# Patient Record
Sex: Female | Born: 1985
Health system: Southern US, Community
[De-identification: ages and names within clinical notes are randomized; demographics above are authoritative.]

## PROBLEM LIST (undated history)

## (undated) DIAGNOSIS — F329 Major depressive disorder, single episode, unspecified: Secondary | ICD-10-CM

## (undated) DIAGNOSIS — F32A Depression, unspecified: Secondary | ICD-10-CM

## (undated) DIAGNOSIS — F419 Anxiety disorder, unspecified: Secondary | ICD-10-CM

## (undated) HISTORY — PX: GASTRIC ROUX-EN-Y: SHX5262

## (undated) HISTORY — PX: CHOLECYSTECTOMY: SHX55

## (undated) HISTORY — PX: WISDOM TOOTH EXTRACTION: SHX21

## (undated) HISTORY — PX: HERNIA REPAIR: SHX51

## (undated) HISTORY — PX: ABDOMINAL HYSTERECTOMY: SHX81

---
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
## 2004-03-05 ENCOUNTER — Emergency Department (HOSPITAL_COMMUNITY): Admission: EM | Admit: 2004-03-05 | Discharge: 2004-03-05 | Payer: Self-pay | Admitting: Emergency Medicine

## 2006-10-06 IMAGING — CT CT ABDOMEN W/ CM
1 of 3 series · 14 of 32 positions shown, 19 images · IV contrast (OMNI 300 100)
Comparison: None.

CLINICAL DATA: Left abdominal pain following a fall down stairs three days ago.
 CT ABDOMEN AND PELVIS WITH CONTRAST:
TECHNIQUE: Routine multidetector helical imaging utilizing oral and IV contrast ? 100 cc of Omnipaque 300.
 CT ABDOMEN WITH CONTRAST:
 The lung bases are clear.  No obvious rib fractures or pleural effusions.  The liver, spleen, pancreas, and adrenals are normal.  No hemoperitoneum or free air.  Early and delayed images through the kidneys show no obstruction or lacerations.  No retroperitoneal hematoma.  No adenopathy or ascites.

[Series 2: routine abdomen · axial · 0.74mm/px · z∈[-431,-71]mm · 14 of 81 slices shown, 19 images]
[im 5/81  soft-tissue]
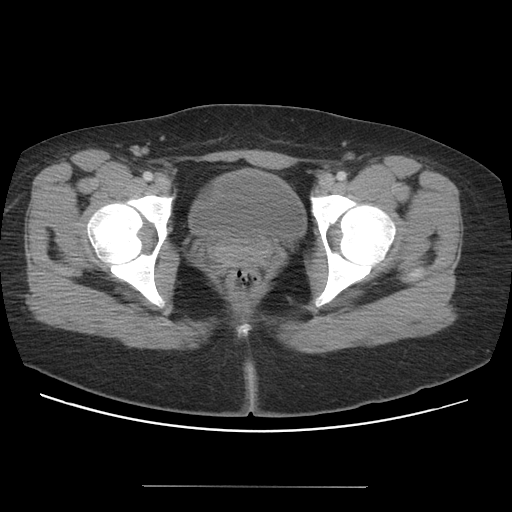
[im 5/81  bone]
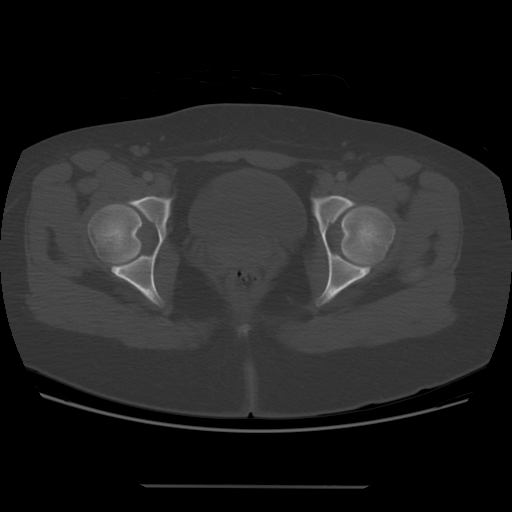
[im 13/81  soft-tissue]
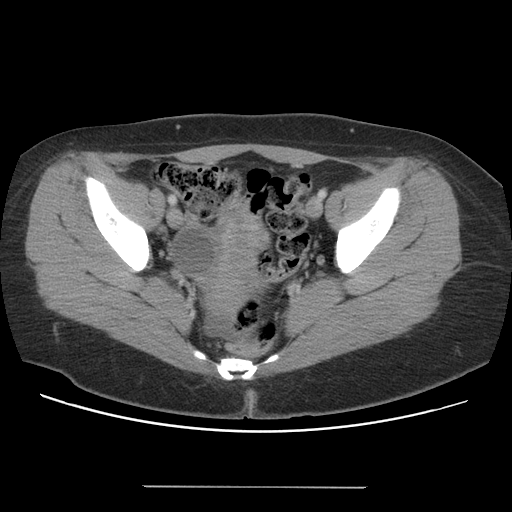
[im 17/81  soft-tissue]
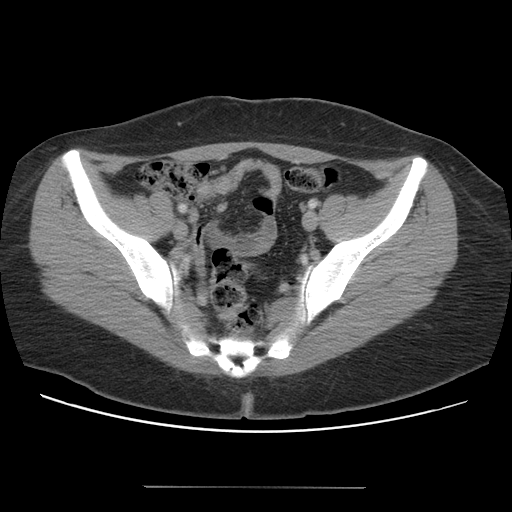
[im 22/81  soft-tissue]
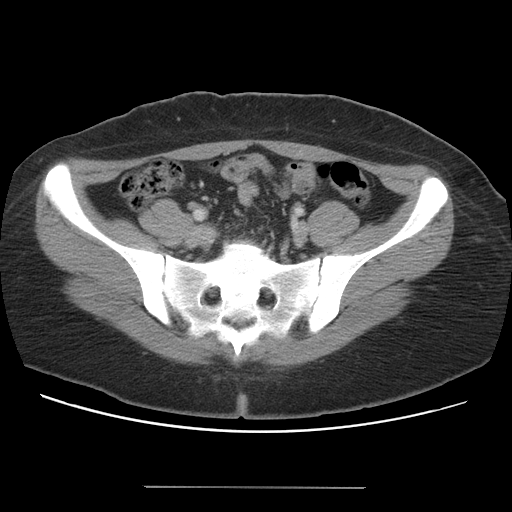
[im 30/81  soft-tissue]
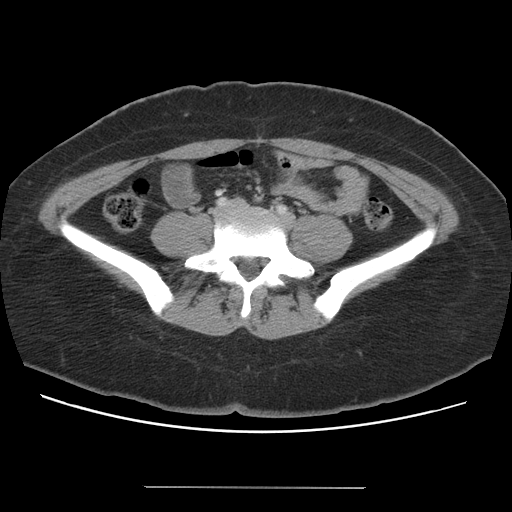
[im 34/81  soft-tissue]
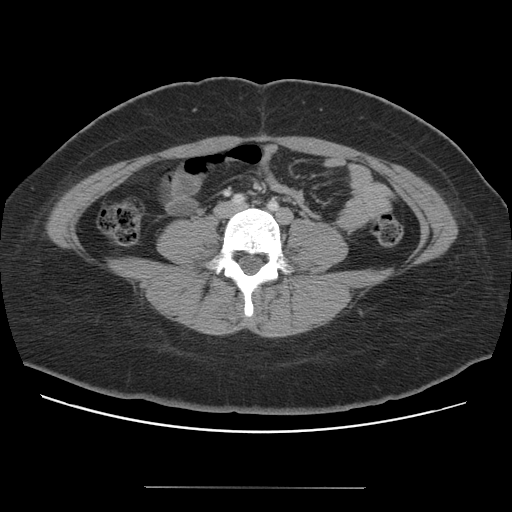
[im 43/81  soft-tissue]
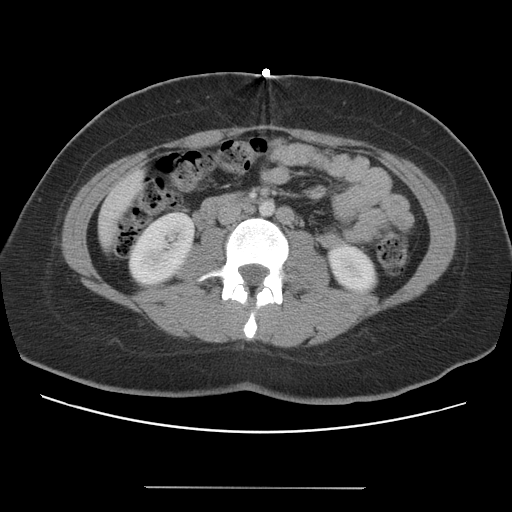
[im 47/81  soft-tissue]
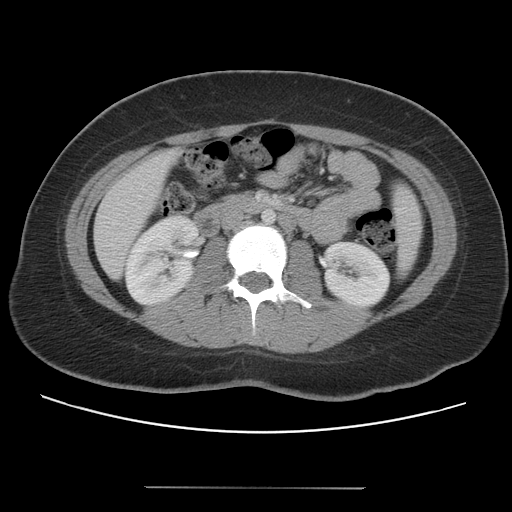
[im 51/81  soft-tissue]
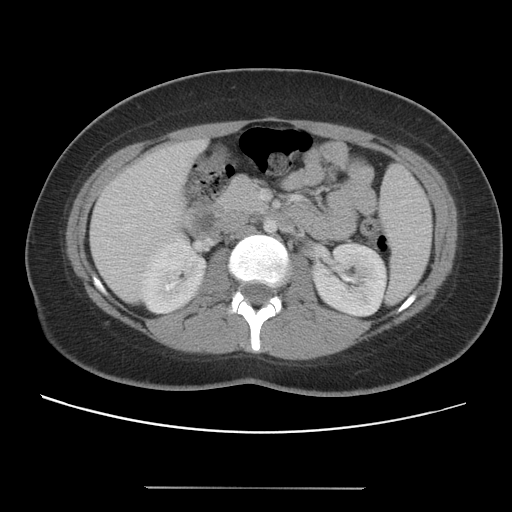
[im 51/81  bone]
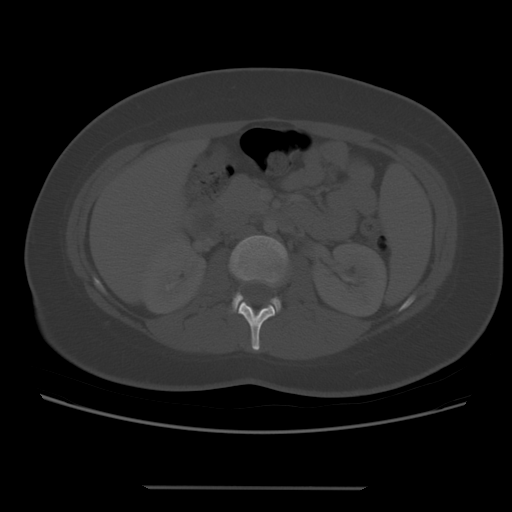
[im 59/81  soft-tissue]
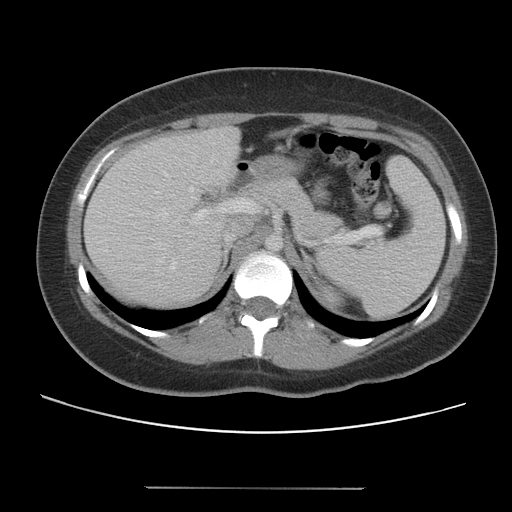
[im 64/81  soft-tissue]
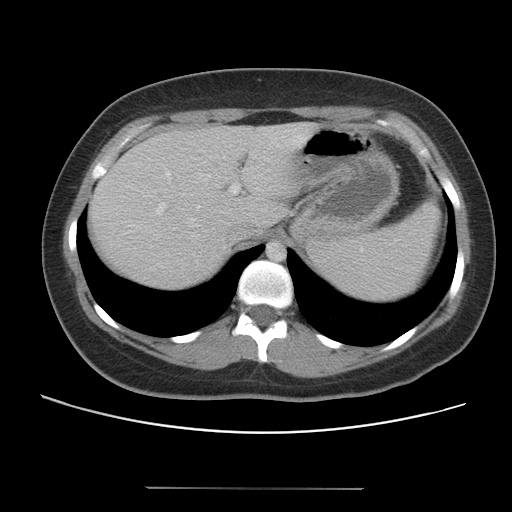
[im 64/81  lung]
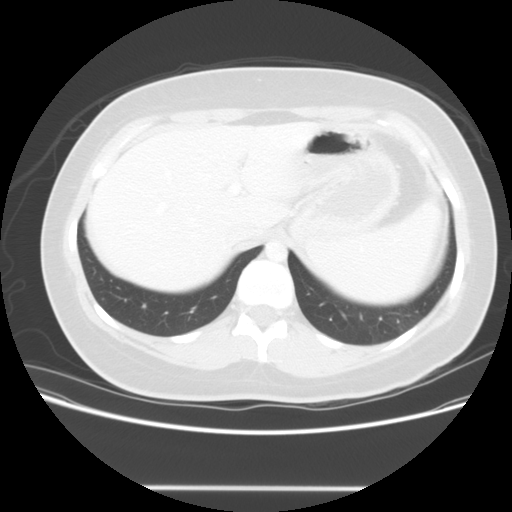
[im 68/81  soft-tissue]
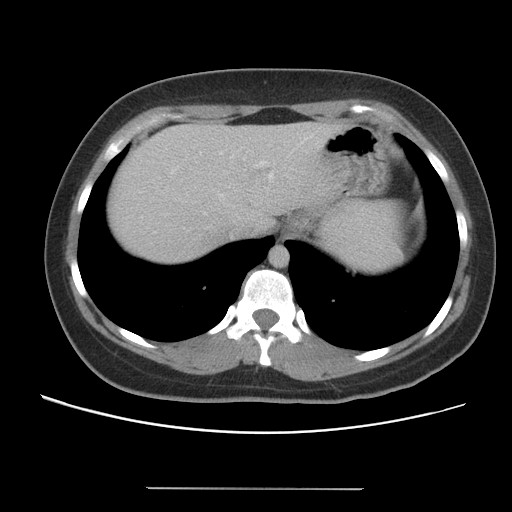
[im 68/81  lung]
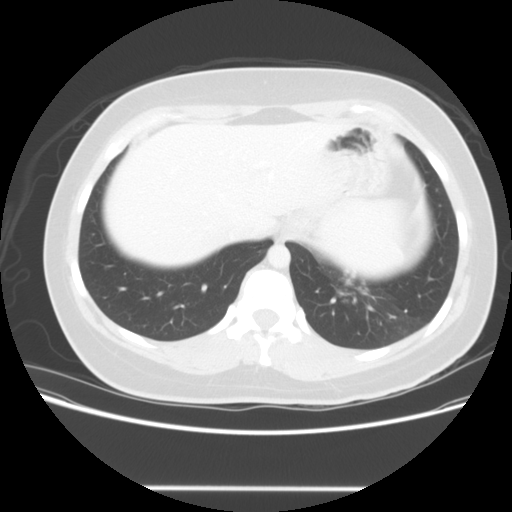
[im 72/81  lung]
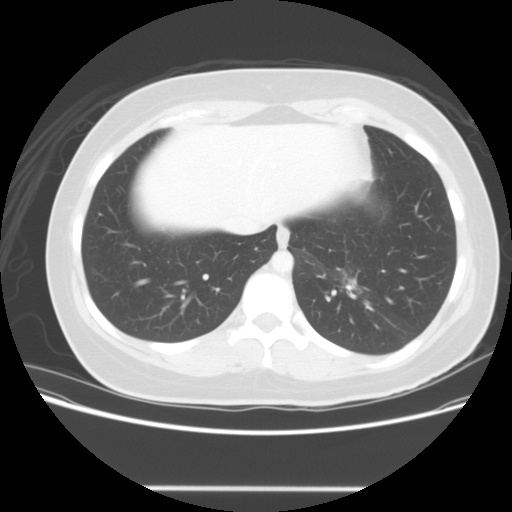
[im 76/81  soft-tissue]
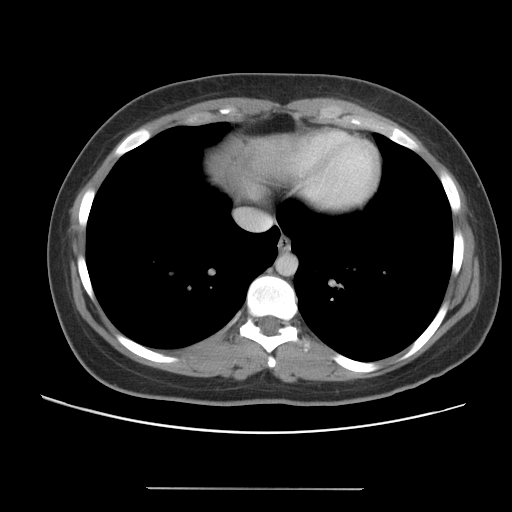
[im 76/81  lung]
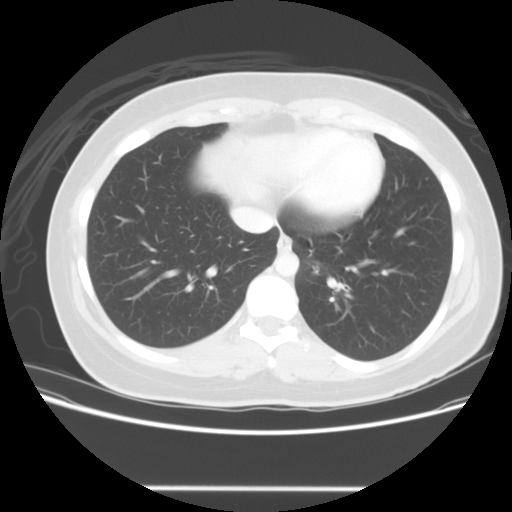

[14 of 32 positions shown; findings below may reference images not displayed]

IMPRESSION: No acute or significant findings.
 CT PELVIS WITH CONTRAST:
 There is a T-shaped IUD in place.  There is a 3 cm right ovarian cyst.  No hemoperitoneum or other acute changes.
IMPRESSION: Unremarkable except for a 3 cm right ovarian cyst.

## 2017-05-04 ENCOUNTER — Other Ambulatory Visit: Payer: Self-pay

## 2017-05-04 ENCOUNTER — Ambulatory Visit
Admission: EM | Admit: 2017-05-04 | Discharge: 2017-05-04 | Disposition: A | Discharge status: Home / Self Care | Payer: BLUE CROSS/BLUE SHIELD | Attending: Family Medicine | Admitting: Family Medicine

## 2017-05-04 ENCOUNTER — Encounter: Payer: Self-pay | Admitting: *Deleted

## 2017-05-04 DIAGNOSIS — M5489 Other dorsalgia: Secondary | ICD-10-CM

## 2017-05-04 DIAGNOSIS — M542 Cervicalgia: Secondary | ICD-10-CM

## 2017-05-04 DIAGNOSIS — M7918 Myalgia, other site: Secondary | ICD-10-CM

## 2017-05-04 MED ORDER — CYCLOBENZAPRINE HCL 10 MG PO TABS: 10.0000 mg | ORAL_TABLET | Freq: Three times a day (TID) | ORAL | 0 refills | Status: DC | PRN

## 2017-05-04 MED ORDER — KETOROLAC TROMETHAMINE 60 MG/2ML IM SOLN
60.0000 mg | Freq: Once | INTRAMUSCULAR | Status: AC
  Administered 2017-05-04: 60 mg via INTRAMUSCULAR

## 2017-05-04 MED ORDER — TRAMADOL HCL 50 MG PO TABS: 50.0000 mg | ORAL_TABLET | Freq: Three times a day (TID) | ORAL | 0 refills | Status: DC | PRN

## 2017-05-04 NOTE — ED Triage Notes (Signed)
Patient started having right arm / shoulder pain 3 days ago while she was moving boxes.

## 2017-05-04 NOTE — Discharge Instructions (Signed)
Rest, heat.  Flexeril (muscle relaxant) as directed.  Tramadol for pain.  Take care  Dr. Adriana Simasook

## 2017-05-04 NOTE — ED Provider Notes (Signed)
[facility]  CSN: [id number] Arrival date & time: 05/04/17  4098  History   Chief Complaint Chief Complaint  Patient presents with  . Neck Pain   HPI  32 year old female presents with the above complaint.  Patient states that she was carrying a box of books on Monday.  The box subsequently slipped forward and she fell forward.  She states that in doing so she injured her neck and upper back particularly on the right side.  She has had some numbness and tingling of her right upper extremity.  Her pain is severe.  She has tried Tylenol, Motrin, ice and heat without improvement.  She states that her pain remains severe.  Worse with range of motion.  No relieving factors.  No other associated symptoms.  No other complaints.  PMH: Hx of Obesity  Past Surgical History:  Procedure Laterality Date  . CESAREAN SECTION    . CHOLECYSTECTOMY    . GASTRIC ROUX-EN-Y    . HERNIA REPAIR    . WISDOM TOOTH EXTRACTION     OB History    No data available     Home Medications    Prior to Admission medications   Medication Sig Start Date End Date Taking? Authorizing Provider  cyclobenzaprine (FLEXERIL) 10 MG tablet Take 1 tablet (10 mg total) by mouth 3 (three) times daily as needed. 05/04/17   Tommie Sams, DO  traMADol (ULTRAM) 50 MG tablet Take 1 tablet (50 mg total) by mouth every 8 (eight) hours as needed. 05/04/17   Tommie Sams, DO   Family History Family History  Problem Relation Age of Onset  . COPD Mother    Social History Social History   Tobacco Use  . Smoking status: Former Smoker    Types: Cigarettes  . Smokeless tobacco: Never Used  Substance Use Topics  . Alcohol use: Yes    Frequency: Never  . Drug use: No   Allergies   Bee venom; Penicillins; and Latex   Review of Systems Review of Systems  Constitutional: Negative.   Musculoskeletal: Positive for back pain, myalgias and neck pain.  Neurological:       Numbness, tingling.   Physical Exam Triage  Vital Signs ED Triage Vitals  Enc Vitals Group     BP 05/04/17 0919 120/72     Pulse Rate 05/04/17 0919 (!) 55     Resp 05/04/17 0919 16     Temp 05/04/17 0919 98.2 F (36.8 C)     Temp Source 05/04/17 0919 Oral     SpO2 05/04/17 0919 100 %     Weight 05/04/17 0922 191 lb (86.6 kg)     Height 05/04/17 0922 5\' 6"  (1.676 m)     Head Circumference --      Peak Flow --      Pain Score 05/04/17 0920 9     Pain Loc --      Pain Edu? --      Excl. in GC? --    Updated Vital Signs BP 120/72 (BP Location: Left Arm)   Pulse (!) 55   Temp 98.2 F (36.8 C) (Oral)   Resp 16   Ht 5\' 6"  (1.676 m)   Wt 191 lb (86.6 kg)   LMP 04/23/2017   SpO2 100%   BMI 30.83 kg/m   Physical Exam  Constitutional: She is oriented to person, place, and time. She appears well-developed. No distress.  Cardiovascular: Normal rate and regular rhythm.  Pulmonary/Chest: Effort  normal and breath sounds normal. She has no wheezes. She has no rales.  Musculoskeletal:  Patient exquisitely tender to palpation of the paraspinal musculature on the right (neck and thoracic spine).  Exquisite tenderness of the right trapezius.  Her pain seems out of proportion to the force applied.  Neurological: She is alert and oriented to person, place, and time.  Psychiatric: She has a normal mood and affect. Her behavior is normal.  Nursing note and vitals reviewed.  UC Treatments / Results  Labs (all labs ordered are listed, but only abnormal results are displayed) Labs Reviewed - No data to display  EKG  EKG Interpretation None       Radiology No results found.  Procedures Procedures (including critical care time)  Medications Ordered in UC Medications  ketorolac (TORADOL) injection 60 mg (60 mg Intramuscular Given 05/04/17 1055)     Initial Impression / Assessment and Plan / UC Course  I have reviewed the triage vital signs and the nursing notes.  Pertinent labs & imaging results that were available during  my care of the patient were reviewed by me and considered in my medical decision making (see chart for details).     32 year old female presents with muscular skeletal pain.  Treating with Toradol injection today. Flexeril and Tramadol as needed.  Final Clinical Impressions(s) / UC Diagnoses   Final diagnoses:  Musculoskeletal pain    ED Discharge Orders        Ordered    traMADol (ULTRAM) 50 MG tablet  Every 8 hours PRN     05/04/17 1044    cyclobenzaprine (FLEXERIL) 10 MG tablet  3 times daily PRN     05/04/17 1044     Controlled Substance Prescriptions [place] Controlled Substance Registry consulted? Not Applicable   Tommie SamsCook, Crysta Gulick G, DO 05/04/17 1112

## 2017-08-21 ENCOUNTER — Ambulatory Visit
Admission: EM | Admit: 2017-08-21 | Discharge: 2017-08-21 | Disposition: A | Discharge status: Home / Self Care | Payer: BLUE CROSS/BLUE SHIELD | Attending: Family Medicine | Admitting: Family Medicine

## 2017-08-21 ENCOUNTER — Other Ambulatory Visit: Payer: Self-pay

## 2017-08-21 DIAGNOSIS — B9789 Other viral agents as the cause of diseases classified elsewhere: Secondary | ICD-10-CM | POA: Diagnosis not present

## 2017-08-21 DIAGNOSIS — J029 Acute pharyngitis, unspecified: Secondary | ICD-10-CM | POA: Diagnosis not present

## 2017-08-21 LAB — RAPID STREP SCREEN (MED CTR MEBANE ONLY): Streptococcus, Group A Screen (Direct): NEGATIVE

## 2017-08-21 MED ORDER — LIDOCAINE VISCOUS HCL 2 % MT SOLN: OROMUCOSAL | 0 refills | Status: DC

## 2017-08-21 NOTE — ED Provider Notes (Signed)
[facility]    CSN: [id number] Arrival date & time: 08/21/17  1343     History   Chief Complaint Chief Complaint  Patient presents with  . Nasal Congestion  . Sore Throat    HPI Angela Jones is a 32 y.o. female.   The history is provided by the patient.  URI  Presenting symptoms: congestion, rhinorrhea and sore throat   Severity:  Moderate Onset quality:  Sudden Duration:  3 days Timing:  Constant Progression:  Worsening Chronicity:  New Relieved by:  Nothing Ineffective treatments:  OTC medications Associated symptoms: no headaches, no sinus pain and no wheezing   Risk factors: recent illness and sick contacts   Risk factors: not elderly, no chronic cardiac disease, no chronic kidney disease, no chronic respiratory disease, no diabetes mellitus, no immunosuppression and no recent travel     History reviewed. No pertinent past medical history.  There are no active problems to display for this patient.   Past Surgical History:  Procedure Laterality Date  . ABDOMINAL HYSTERECTOMY    . CESAREAN SECTION    . CHOLECYSTECTOMY    . GASTRIC ROUX-EN-Y    . HERNIA REPAIR    . WISDOM TOOTH EXTRACTION      OB History   None      Home Medications    Prior to Admission medications   Medication Sig Start Date End Date Taking? Authorizing Provider  EPINEPHrine (EPIPEN JR 2-PAK) 0.15 MG/0.3ML injection Inject into the muscle. 12/02/10  Yes [provider]  oxybutynin (DITROPAN) 5 MG tablet Take 5 mg by mouth 3 (three) times daily.   Yes [provider]  oxyCODONE (OXY IR/ROXICODONE) 5 MG immediate release tablet  08/03/14  Yes [provider]  promethazine (PHENERGAN) 25 MG tablet Take by mouth. 12/25/14  Yes [provider]  cyclobenzaprine (FLEXERIL) 10 MG tablet Take 1 tablet (10 mg total) by mouth 3 (three) times daily as needed. 05/04/17   Tommie Sams, DO  escitalopram (LEXAPRO) 10 MG tablet TAKE 1 AND 1/2 TABLETS  DAILY BY MOUTH 07/26/17   [provider]  gabapentin (NEURONTIN) 300 MG capsule 1 CAP BEDTIME X7DAYS, 2CAPS X7DAYS,1CAP IN AM,2CAPS AT NIGHT X7DAYS,1CAP AM/AFTERNOON,2CAPS AT NIGHT 06/06/17   [provider]  lidocaine (XYLOCAINE) 2 % solution 20 ml gargle and spit q 6 hours prn 08/21/17   Payton Mccallum, MD  ondansetron (ZOFRAN) 4 MG tablet Take by mouth.    [provider]  tiZANidine (ZANAFLEX) 4 MG tablet TAKE 2 TABLETS (8 MG TOTAL) BY MOUTH EVERY EIGHT (8) HOURS AS NEEDED. 08/01/17   [provider]  traMADol (ULTRAM) 50 MG tablet Take 1 tablet (50 mg total) by mouth every 8 (eight) hours as needed. 05/04/17   Tommie Sams, DO    Family History Family History  Problem Relation Age of Onset  . COPD Mother     Social History Social History   Tobacco Use  . Smoking status: Former Smoker    Types: Cigarettes  . Smokeless tobacco: Never Used  Substance Use Topics  . Alcohol use: Yes    Frequency: Never    Comment: social  . Drug use: No     Allergies   Bee venom; Penicillins; Piperacillin sod-tazobactam so; Hydromorphone; and Latex   Review of Systems Review of Systems  HENT: Positive for congestion, rhinorrhea and sore throat. Negative for sinus pain.   Respiratory: Negative for wheezing.   Neurological: Negative for headaches.  Physical Exam Triage Vital Signs ED Triage Vitals  Enc Vitals Group     BP 08/21/17 1406 (!) 96/58     Pulse Rate 08/21/17 1406 62     Resp 08/21/17 1406 16     Temp 08/21/17 1406 98.8 F (37.1 C)     Temp Source 08/21/17 1406 Oral     SpO2 08/21/17 1406 100 %     Weight 08/21/17 1407 181 lb (82.1 kg)     Height 08/21/17 1407  (1.651 m)     Head Circumference --      Peak Flow --      Pain Score 08/21/17 1407 8     Pain Loc --      Pain Edu? --      Excl. in GC? --    No data found.  Updated Vital Signs BP (!) 107/59 (BP Location: Right Arm)   Pulse 62   Temp 98.8 F (37.1 C) (Oral)    Resp 16   Ht  (1.651 m)   Wt 181 lb (82.1 kg)   LMP 04/23/2017   SpO2 100%   BMI 30.12 kg/m   Visual Acuity Right Eye Distance:   Left Eye Distance:   Bilateral Distance:    Right Eye Near:   Left Eye Near:    Bilateral Near:     Physical Exam  Constitutional: She appears well-developed and well-nourished. No distress.  HENT:  Head: Normocephalic and atraumatic.  Right Ear: Tympanic membrane, external ear and ear canal normal.  Left Ear: Tympanic membrane, external ear and ear canal normal.  Nose: Mucosal edema and rhinorrhea present. No nose lacerations, sinus tenderness, nasal deformity, septal deviation or nasal septal hematoma. No epistaxis.  No foreign bodies. Right sinus exhibits no maxillary sinus tenderness and no frontal sinus tenderness. Left sinus exhibits no maxillary sinus tenderness and no frontal sinus tenderness.  Mouth/Throat: Uvula is midline and mucous membranes are normal. Posterior oropharyngeal erythema present. No oropharyngeal exudate, posterior oropharyngeal edema or tonsillar abscesses. No tonsillar exudate.  Eyes: Pupils are equal, round, and reactive to light. Conjunctivae and EOM are normal. Right eye exhibits no discharge. Left eye exhibits no discharge. No scleral icterus.  Neck: Normal range of motion. Neck supple. No thyromegaly present.  Cardiovascular: Normal rate, regular rhythm and normal heart sounds.  Pulmonary/Chest: Effort normal and breath sounds normal. No stridor. No respiratory distress. She has no wheezes. She has no rales.  Lymphadenopathy:    She has no cervical adenopathy.  Skin: She is not diaphoretic.  Nursing note and vitals reviewed.    UC Treatments / Results  Labs (all labs ordered are listed, but only abnormal results are displayed) Labs Reviewed  RAPID STREP SCREEN ([facility] ONLY)  CULTURE, GROUP A STREP [facility])    EKG None  Radiology No results found.  Procedures Procedures (including critical care  time)  Medications Ordered in UC Medications - No data to display  Initial Impression / Assessment and Plan / UC Course  I have reviewed the triage vital signs and the nursing notes.  Pertinent labs & imaging results that were available during my care of the patient were reviewed by me and considered in my medical decision making (see chart for details).      Final Clinical Impressions(s) / UC Diagnoses   Final diagnoses:  Viral pharyngitis    ED Prescriptions    Medication Sig Dispense Auth. Provider   lidocaine (XYLOCAINE) 2 % solution 20 ml  gargle and spit q 6 hours prn 100 mL Payton Mccallum, MD     1. Lab results and diagnosis reviewed with patient 2. rx as per orders above; reviewed possible side effects, interactions, risks and benefits  3. Recommend supportive treatment with rest, fluids, otc analgesics prn 4. Follow-up prn if symptoms worsen or don't improve  Controlled Substance Prescriptions [place] Controlled Substance Registry consulted? Not Applicable   Payton Mccallum, MD 08/21/17 [phone]

## 2017-08-21 NOTE — ED Triage Notes (Addendum)
Pt with nasal congestion and sore throat x past few days. Fever today and some cough. Pain 8/10. Pt is 5 days post-op after hysterectomy

## 2017-08-24 LAB — CULTURE, GROUP A STREP (THRC)

## 2017-09-16 ENCOUNTER — Other Ambulatory Visit: Payer: Self-pay

## 2017-09-16 ENCOUNTER — Ambulatory Visit
Admission: EM | Admit: 2017-09-16 | Discharge: 2017-09-16 | Disposition: A | Discharge status: Home / Self Care | Payer: BLUE CROSS/BLUE SHIELD | Attending: Emergency Medicine | Admitting: Emergency Medicine

## 2017-09-16 DIAGNOSIS — J020 Streptococcal pharyngitis: Secondary | ICD-10-CM | POA: Diagnosis not present

## 2017-09-16 HISTORY — DX: Major depressive disorder, single episode, unspecified: F32.9

## 2017-09-16 HISTORY — DX: Depression, unspecified: F32.A

## 2017-09-16 HISTORY — DX: Anxiety disorder, unspecified: F41.9

## 2017-09-16 LAB — RAPID STREP SCREEN (MED CTR MEBANE ONLY): Streptococcus, Group A Screen (Direct): POSITIVE — AB

## 2017-09-16 MED ORDER — ONDANSETRON 8 MG PO TBDP: 8.0000 mg | ORAL_TABLET | Freq: Three times a day (TID) | ORAL | 0 refills | Status: DC | PRN

## 2017-09-16 MED ORDER — DEXAMETHASONE SODIUM PHOSPHATE 10 MG/ML IJ SOLN
10.0000 mg | Freq: Once | INTRAMUSCULAR | Status: AC
  Administered 2017-09-16: 10 mg via INTRAMUSCULAR

## 2017-09-16 MED ORDER — AZITHROMYCIN 500 MG PO TABS: 500.0000 mg | ORAL_TABLET | Freq: Every day | ORAL | 0 refills | Status: AC

## 2017-09-16 NOTE — Discharge Instructions (Addendum)
1 gram of Tylenol and 400 mg ibuprofen together 3-4 times a day as needed for pain.  Make sure you drink plenty of extra fluids.  Some people find salt water gargles and  Traditional Medicinal's "Throat Coat" tea helpful. Take 5 mL of liquid Benadryl and 5 mL of Maalox. Mix it together, and then hold it in your mouth for as long as you can and then swallow. You may do this 4 times a day.  10 you pushing fluids.  Stop the Colace.  Zofran will help with nausea, vomiting and the diarrhea.  Go to www.goodrx.com to look up your medications. This will give you a list of where you can find your prescriptions at the most affordable prices. Or ask the pharmacist what the cash price is, or if they have any other discount programs available to help make your medication more affordable. This can be less expensive than what you would pay with insurance.

## 2017-09-16 NOTE — ED Provider Notes (Signed)
HPI  SUBJECTIVE:  Patient reports sore throat starting 3 days ago. Sx worse with swallowing.  Sx better with Tylenol.  Has also been pushing fluids and drinking a liquid diet.   + Fever tmax 102 + Swollen neck glands   No neck stiffness  + Cough/URI sxs reports nasal congestion, rhinorrhea, postnasal drip + Myalgias + Headache No Rash     No Recent Strep Exposure + Diffuse abdominal Pain with vomiting and diarrhea starting yesterday but she is able to tolerate liquids. No reflux sxs No Allergy sxs  No Breathing difficulty, voice changes.  + sensation of throat swelling shut No Drooling No Trismus No abx in past month  + antipyretic in past 4-6 hrs took Tylenol 2 hours prior to evaluation She has a past medical history of gastric bypass 3 years ago, status post recent hysterectomy, frequent strep, scarlet fever.  No history of mono, diabetes, hypertension. LMP: Status post hysterectomy. ZOX:[facility], [place] [facility]    Past Medical History:  Diagnosis Date  . Anxiety   . Depression     Past Surgical History:  Procedure Laterality Date  . ABDOMINAL HYSTERECTOMY    . CESAREAN SECTION    . CHOLECYSTECTOMY    . GASTRIC ROUX-EN-Y    . HERNIA REPAIR    . WISDOM TOOTH EXTRACTION      Family History  Problem Relation Age of Onset  . COPD Mother     Social History   Tobacco Use  . Smoking status: Former Smoker    Types: Cigarettes    Last attempt to quit: 09/16/2004    Years since quitting: 13.0  . Smokeless tobacco: Never Used  Substance Use Topics  . Alcohol use: Yes    Frequency: Never    Comment: social  . Drug use: No    No current facility-administered medications for this encounter.   Current Outpatient Medications:  .  azithromycin (ZITHROMAX) 500 MG tablet, Take 1 tablet (500 mg total) by mouth daily for 5 days., Disp: 5 tablet, Rfl: 0 .  EPINEPHrine (EPIPEN JR 2-PAK) 0.15 MG/0.3ML injection, Inject into the muscle., Disp: , Rfl:  .  escitalopram  (LEXAPRO) 10 MG tablet, TAKE 1 AND 1/2 TABLETS DAILY BY MOUTH, Disp: , Rfl: 1 .  gabapentin (NEURONTIN) 300 MG capsule, 1 CAP BEDTIME X7DAYS, 2CAPS X7DAYS,1CAP IN AM,2CAPS AT NIGHT X7DAYS,1CAP AM/AFTERNOON,2CAPS AT NIGHT, Disp: , Rfl: 2 .  ondansetron (ZOFRAN) 4 MG tablet, Take by mouth., Disp: , Rfl:  .  ondansetron (ZOFRAN-ODT) 8 MG disintegrating tablet, Take 1 tablet (8 mg total) by mouth every 8 (eight) hours as needed for nausea or vomiting., Disp: 20 tablet, Rfl: 0 .  promethazine (PHENERGAN) 25 MG tablet, Take by mouth., Disp: , Rfl:  .  tiZANidine (ZANAFLEX) 4 MG tablet, TAKE 2 TABLETS (8 MG TOTAL) BY MOUTH EVERY EIGHT (8) HOURS AS NEEDED., Disp: , Rfl: 1  Allergies  Allergen Reactions  . Bee Venom Anaphylaxis  . Penicillins Anaphylaxis  . Piperacillin Sod-Tazobactam So Anaphylaxis  . Skelaxin [Metaxalone] Other (See Comments)    "Red like a lobster"  . Hydromorphone Other (See Comments)    Other reaction(s): Confusion (intolerance) "makes me crazy" Reaction post op. Mixing of anesthia and dilaudid caused a reaction. "loopy"   . Latex Rash     ROS  As noted in HPI.   Physical Exam  BP 120/78 (BP Location: Right Arm)   Pulse 68   Temp 98.4 F (36.9 C) (Oral)   Resp 18  Ht 5\' 5"  (1.651 m)   Wt 181 lb (82.1 kg)   LMP 04/23/2017   SpO2 100%   BMI 30.12 kg/m   Constitutional: Well developed, well nourished, no acute distress Eyes:  EOMI, conjunctiva normal bilaterally HENT: Normocephalic, atraumatic,mucus membranes moist. +  nasal congestion +  erythematous oropharynx +  enlarged tonsils +  exudates. Uvula midline.  Respiratory: Normal inspiratory effort Cardiovascular: Normal rate, no murmurs, rubs, gallops GI: nondistended, she is mild tenderness.  No guarding, rebound.  Active bowel sounds.  No appreciable splenomegaly skin: No rash, skin intact Lymph: + Anterior cervical LN.  No posterior cervical lymphadenopathy Musculoskeletal: no deformities Neurologic:  Alert & oriented x 3, no focal neuro deficits Psychiatric: Speech and behavior appropriate.   ED Course   Medications  dexamethasone (DECADRON) injection 10 mg (10 mg Intramuscular Given 09/16/17 1644)    Orders Placed This Encounter  Procedures  . Rapid Strep Screen [facility])    Standing Status:   Standing    Number of Occurrences:   1    Results for orders placed or performed during the hospital encounter of 09/16/17 (from the past 24 hour(s))  Rapid Strep Screen ([facility])     Status: Abnormal   Collection Time: 09/16/17  2:00 PM  Result Value Ref Range   Streptococcus, Group A Screen (Direct) POSITIVE (A) NEGATIVE   No results found.  ED Clinical Impression  Strep pharyngitis   ED Assessment/Plan  Rapid strep positive.  Patient appears to be in significant amount discomfort, so we will give 10 mg dexamethasone IM x1.  Sending home with azithromycin for 5 days.  Reports anaphylaxis with penicillin. Home with a very short course of ibuprofen combined with Tylenol, she states that she can take short courses of NSAIDs, Benadryl/Maalox mixture.  Also Zofran, diarrhea.  Advised her to stop the Colace as I think this is contributing to her diarrhea.  Abdomen is benign.  Patient to followup with PMD when necessary,.   Discussed labs,  MDM, plan and followup with patient. Discussed sn/sx that should prompt return to the ED. patient agrees with plan.   Meds ordered this encounter  Medications  . dexamethasone (DECADRON) injection 10 mg  . azithromycin (ZITHROMAX) 500 MG tablet    Sig: Take 1 tablet (500 mg total) by mouth daily for 5 days.    Dispense:  5 tablet    Refill:  0  . ondansetron (ZOFRAN-ODT) 8 MG disintegrating tablet    Sig: Take 1 tablet (8 mg total) by mouth every 8 (eight) hours as needed for nausea or vomiting.    Dispense:  20 tablet    Refill:  0     *This clinic note was created using Scientist, clinical (histocompatibility and immunogenetics)Dragon dictation software. Therefore, there may be  occasional mistakes despite careful proofreading.    Domenick GongMortenson, Hafsah Hendler, MD 09/16/17 1718

## 2017-09-16 NOTE — ED Triage Notes (Signed)
Pt with sore throat, fever, white spots on tonsils, and n/v.  Pain 8/10

## 2017-11-21 DIAGNOSIS — H5200 Hypermetropia, unspecified eye: Secondary | ICD-10-CM | POA: Diagnosis not present

## 2017-12-03 DIAGNOSIS — M273 Alveolitis of jaws: Secondary | ICD-10-CM | POA: Diagnosis not present

## 2018-01-09 DIAGNOSIS — Z23 Encounter for immunization: Secondary | ICD-10-CM | POA: Diagnosis not present

## 2018-02-08 DIAGNOSIS — R799 Abnormal finding of blood chemistry, unspecified: Secondary | ICD-10-CM | POA: Diagnosis not present

## 2018-02-08 DIAGNOSIS — M542 Cervicalgia: Secondary | ICD-10-CM | POA: Diagnosis not present

## 2018-02-08 DIAGNOSIS — R69 Illness, unspecified: Secondary | ICD-10-CM | POA: Diagnosis not present

## 2018-02-08 DIAGNOSIS — Z9884 Bariatric surgery status: Secondary | ICD-10-CM | POA: Diagnosis not present

## 2018-02-08 DIAGNOSIS — R002 Palpitations: Secondary | ICD-10-CM | POA: Diagnosis not present

## 2018-03-10 DIAGNOSIS — J029 Acute pharyngitis, unspecified: Secondary | ICD-10-CM | POA: Diagnosis not present

## 2018-03-10 DIAGNOSIS — H6983 Other specified disorders of Eustachian tube, bilateral: Secondary | ICD-10-CM | POA: Diagnosis not present

## 2018-03-10 DIAGNOSIS — J069 Acute upper respiratory infection, unspecified: Secondary | ICD-10-CM | POA: Diagnosis not present

## 2018-03-10 DIAGNOSIS — R42 Dizziness and giddiness: Secondary | ICD-10-CM | POA: Diagnosis not present

## 2018-05-22 DIAGNOSIS — R6889 Other general symptoms and signs: Secondary | ICD-10-CM | POA: Diagnosis not present

## 2018-06-13 DIAGNOSIS — I471 Supraventricular tachycardia: Secondary | ICD-10-CM | POA: Diagnosis not present

## 2018-06-13 DIAGNOSIS — R69 Illness, unspecified: Secondary | ICD-10-CM | POA: Diagnosis not present

## 2018-07-02 DIAGNOSIS — J0100 Acute maxillary sinusitis, unspecified: Secondary | ICD-10-CM | POA: Diagnosis not present

## 2018-07-04 DIAGNOSIS — R69 Illness, unspecified: Secondary | ICD-10-CM | POA: Diagnosis not present

## 2018-07-18 DIAGNOSIS — R69 Illness, unspecified: Secondary | ICD-10-CM | POA: Diagnosis not present

## 2018-07-27 DIAGNOSIS — R69 Illness, unspecified: Secondary | ICD-10-CM | POA: Diagnosis not present

## 2018-08-06 DIAGNOSIS — R05 Cough: Secondary | ICD-10-CM | POA: Diagnosis not present

## 2018-08-07 DIAGNOSIS — R05 Cough: Secondary | ICD-10-CM | POA: Diagnosis not present

## 2018-08-07 DIAGNOSIS — Z20828 Contact with and (suspected) exposure to other viral communicable diseases: Secondary | ICD-10-CM | POA: Diagnosis not present

## 2018-08-13 DIAGNOSIS — R69 Illness, unspecified: Secondary | ICD-10-CM | POA: Diagnosis not present

## 2018-08-13 DIAGNOSIS — F430 Acute stress reaction: Secondary | ICD-10-CM | POA: Diagnosis not present

## 2018-08-22 DIAGNOSIS — R002 Palpitations: Secondary | ICD-10-CM | POA: Diagnosis not present

## 2018-08-22 DIAGNOSIS — I471 Supraventricular tachycardia: Secondary | ICD-10-CM | POA: Diagnosis not present

## 2018-08-22 DIAGNOSIS — I491 Atrial premature depolarization: Secondary | ICD-10-CM | POA: Diagnosis not present

## 2018-08-22 DIAGNOSIS — Z8679 Personal history of other diseases of the circulatory system: Secondary | ICD-10-CM | POA: Diagnosis not present

## 2018-08-27 DIAGNOSIS — R002 Palpitations: Secondary | ICD-10-CM | POA: Diagnosis not present

## 2018-08-27 DIAGNOSIS — I501 Left ventricular failure: Secondary | ICD-10-CM | POA: Diagnosis not present

## 2018-09-06 DIAGNOSIS — I491 Atrial premature depolarization: Secondary | ICD-10-CM | POA: Diagnosis not present

## 2018-09-06 DIAGNOSIS — R002 Palpitations: Secondary | ICD-10-CM | POA: Diagnosis not present

## 2018-09-06 DIAGNOSIS — R000 Tachycardia, unspecified: Secondary | ICD-10-CM | POA: Diagnosis not present

## 2018-09-06 DIAGNOSIS — R69 Illness, unspecified: Secondary | ICD-10-CM | POA: Diagnosis not present

## 2018-10-09 DIAGNOSIS — R000 Tachycardia, unspecified: Secondary | ICD-10-CM | POA: Diagnosis not present

## 2018-10-09 DIAGNOSIS — R002 Palpitations: Secondary | ICD-10-CM | POA: Diagnosis not present

## 2018-10-11 DIAGNOSIS — W108XXA Fall (on) (from) other stairs and steps, initial encounter: Secondary | ICD-10-CM | POA: Diagnosis not present

## 2018-10-11 DIAGNOSIS — M546 Pain in thoracic spine: Secondary | ICD-10-CM | POA: Diagnosis not present

## 2018-10-11 DIAGNOSIS — W19XXXA Unspecified fall, initial encounter: Secondary | ICD-10-CM | POA: Diagnosis not present

## 2018-10-11 DIAGNOSIS — M545 Low back pain: Secondary | ICD-10-CM | POA: Diagnosis not present

## 2018-10-16 DIAGNOSIS — J22 Unspecified acute lower respiratory infection: Secondary | ICD-10-CM | POA: Diagnosis not present

## 2018-10-16 DIAGNOSIS — R05 Cough: Secondary | ICD-10-CM | POA: Diagnosis not present

## 2018-10-16 DIAGNOSIS — Z03818 Encounter for observation for suspected exposure to other biological agents ruled out: Secondary | ICD-10-CM | POA: Diagnosis not present

## 2018-10-16 DIAGNOSIS — R002 Palpitations: Secondary | ICD-10-CM | POA: Diagnosis not present

## 2018-10-22 DIAGNOSIS — F3181 Bipolar II disorder: Secondary | ICD-10-CM | POA: Diagnosis not present

## 2018-10-22 DIAGNOSIS — R69 Illness, unspecified: Secondary | ICD-10-CM | POA: Diagnosis not present

## 2018-11-15 DIAGNOSIS — F418 Other specified anxiety disorders: Secondary | ICD-10-CM | POA: Diagnosis not present

## 2018-11-15 DIAGNOSIS — R69 Illness, unspecified: Secondary | ICD-10-CM | POA: Diagnosis not present

## 2018-11-15 DIAGNOSIS — F3181 Bipolar II disorder: Secondary | ICD-10-CM | POA: Diagnosis not present

## 2018-12-30 ENCOUNTER — Ambulatory Visit
Admission: EM | Admit: 2018-12-30 | Discharge: 2018-12-30 | Disposition: A | Discharge status: Home / Self Care | Payer: 59

## 2018-12-30 ENCOUNTER — Other Ambulatory Visit: Payer: Self-pay

## 2018-12-30 ENCOUNTER — Encounter: Payer: Self-pay | Admitting: Emergency Medicine

## 2018-12-30 ENCOUNTER — Ambulatory Visit (INDEPENDENT_AMBULATORY_CARE_PROVIDER_SITE_OTHER): Payer: 59

## 2018-12-30 DIAGNOSIS — M25551 Pain in right hip: Secondary | ICD-10-CM | POA: Diagnosis not present

## 2018-12-30 DIAGNOSIS — M5416 Radiculopathy, lumbar region: Secondary | ICD-10-CM

## 2018-12-30 MED ORDER — MELOXICAM 15 MG PO TABS: 15.0000 mg | ORAL_TABLET | Freq: Every day | ORAL | 0 refills | Status: DC

## 2018-12-30 MED ORDER — KETOROLAC TROMETHAMINE 60 MG/2ML IM SOLN
60.0000 mg | Freq: Once | INTRAMUSCULAR | Status: AC
  Administered 2018-12-30: 60 mg via INTRAMUSCULAR

## 2018-12-30 MED ORDER — TIZANIDINE HCL 4 MG PO CAPS: 4.0000 mg | ORAL_CAPSULE | Freq: Three times a day (TID) | ORAL | 0 refills | Status: DC

## 2018-12-30 NOTE — ED Triage Notes (Signed)
Pt c/o right hip pain and lower back pain. She fell on her stairs about a month ago. She was seeing a chiropractor but has gotten much worse in the last 4 days. She states that she cant not lift her leg, walk with out a lot of pain. She states that it feels like her hip is locking up and looks swollen.

## 2018-12-30 NOTE — Discharge Instructions (Signed)
Avoid symptoms much as possible.  Limit sitting lifting or bending.  Follow-up with your primary care physician if you are not improving in a week.  Be sure to take meloxicam with food

## 2018-12-30 NOTE — ED Provider Notes (Signed)
[facility]    CSN: [id number] Arrival date & time: 12/30/18  1344      History   Chief Complaint Chief Complaint  Patient presents with  . Hip Pain    right    HPI Angela Jones is a 33 y.o. female.   HPI  33 year old female presents with right hip and lower back pain that has worsened over the last 4 days.  She states that it all began when she fell on the stairs forward about a month ago while Carrying groceries.  At that time she did not hit her back or hip but wrenched her back instead.  Been under the care of a chiropractor who has been performing needling and acupuncture and local modalities.  States that the sciatica that she had suffered improved for a while.  Then at work ,at her business of selling books, she was lifting of large amount of books on Tuesday and by Thursday she was in severe pain.  She states now she feels that her right hip is locking and the pain is deep inside.  She says it feels and looks swollen.  Limps badly when she is walking.  Any movement seems to precipitate her pain.  He denies any bowel or bladder dysfunction.        Past Medical History:  Diagnosis Date  . Anxiety   . Depression     There are no active problems to display for this patient.   Past Surgical History:  Procedure Laterality Date  . ABDOMINAL HYSTERECTOMY    . CESAREAN SECTION    . CHOLECYSTECTOMY    . GASTRIC ROUX-EN-Y    . HERNIA REPAIR    . WISDOM TOOTH EXTRACTION      OB History   No obstetric history on file.      Home Medications    Prior to Admission medications   Medication Sig Start Date End Date Taking? Authorizing Provider  diazepam (VALIUM) 5 MG tablet Take 5 mg by mouth as needed for anxiety.   Yes [provider]  EPINEPHrine (EPIPEN JR 2-PAK) 0.15 MG/0.3ML injection Inject into the muscle. 12/02/10  Yes [provider]  escitalopram (LEXAPRO) 10 MG tablet 30 mg.  07/26/17  Yes [provider]  metoprolol  succinate (TOPROL-XL) 25 MG 24 hr tablet TAKE 1 TABLET BY MOUTH EVERY DAY 11/12/18  Yes [provider]  meloxicam (MOBIC) 15 MG tablet Take 1 tablet (15 mg total) by mouth daily. 12/30/18   Lutricia Feil, PA-C  tiZANidine (ZANAFLEX) 4 MG capsule Take 1 capsule (4 mg total) by mouth 3 (three) times daily. 12/30/18   Lutricia Feil, PA-C  gabapentin (NEURONTIN) 300 MG capsule 1 CAP BEDTIME X7DAYS, 2CAPS X7DAYS,1CAP IN AM,2CAPS AT NIGHT X7DAYS,1CAP AM/AFTERNOON,2CAPS AT NIGHT 06/06/17 12/30/18  [provider]  promethazine (PHENERGAN) 25 MG tablet Take by mouth. 12/25/14 12/30/18  [provider]    Family History Family History  Problem Relation Age of Onset  . COPD Mother     Social History Social History   Tobacco Use  . Smoking status: Former Smoker    Types: Cigarettes    Quit date: 09/16/2004    Years since quitting: 14.2  . Smokeless tobacco: Never Used  Substance Use Topics  . Alcohol use: Yes    Frequency: Never    Comment: social  . Drug use: No     Allergies   Bee venom, Penicillins, Piperacillin sod-tazobactam so, Skelaxin [metaxalone], Hydromorphone, and  Latex   Review of Systems Review of Systems  Constitutional: Positive for activity change. Negative for appetite change, chills, fatigue and fever.  Musculoskeletal: Positive for back pain, gait problem and myalgias.  All other systems reviewed and are negative.    Physical Exam Triage Vital Signs ED Triage Vitals  Enc Vitals Group     BP 12/30/18 1423 108/67     Pulse Rate 12/30/18 1423 64     Resp 12/30/18 1423 16     Temp --      Temp Source 12/30/18 1423 Oral     SpO2 12/30/18 1423 99 %     Weight 12/30/18 1417 195 lb (88.5 kg)     Height 12/30/18 1417 5' 5.5" (1.664 m)     Head Circumference --      Peak Flow --      Pain Score 12/30/18 1417 7     Pain Loc --      Pain Edu? --      Excl. in GC? --    No data found.  Updated Vital Signs BP 108/67 (BP Location:  Right Arm)   Pulse 64   Resp 16   Ht 5' 5.5" (1.664 m)   Wt 195 lb (88.5 kg)   LMP 04/23/2017   SpO2 99%   BMI 31.96 kg/m   Visual Acuity Right Eye Distance:   Left Eye Distance:   Bilateral Distance:    Right Eye Near:   Left Eye Near:    Bilateral Near:     Physical Exam Vitals signs and nursing note reviewed. Exam conducted with a chaperone present.  Constitutional:      General: She is not in acute distress.    Appearance: Normal appearance. She is obese. She is not ill-appearing or toxic-appearing.  HENT:     Head: Normocephalic and atraumatic.  Eyes:     Conjunctiva/sclera: Conjunctivae normal.  Neck:     Musculoskeletal: Normal range of motion and neck supple.  Cardiovascular:     Rate and Rhythm: Normal rate and regular rhythm.     Heart sounds: Normal heart sounds.  Pulmonary:     Effort: Pulmonary effort is normal.     Breath sounds: Normal breath sounds.  Musculoskeletal:        General: Tenderness and signs of injury present.     Comments: Examination of the right hip shows pain in the joint anteriorly logrolling and extension.  Patient is able to flex the hip to 100 degrees and is able to extend to 0.  Internal rotation 20 degrees external rotation 70 degrees with discomfort.  Is tender over the greater trochanter region at as well as the's posterior hip and sacroiliac joint.  There is no crepitus present.  There is tenderness to deep palpation in the right paraspinous muscles from approximately L4 level through the sacroiliac joints.  There is no hip esthesia to light touch in the lower extremities.  All muscles tested were strong.  She did give way to hip flexors resistance.  Skin:    General: Skin is warm and dry.  Neurological:     General: No focal deficit present.     Mental Status: She is alert and oriented to person, place, and time.  Psychiatric:        Mood and Affect: Mood normal.        Behavior: Behavior normal.        Thought Content: Thought  content normal.  Judgment: Judgment normal.      UC Treatments / Results  Labs (all labs ordered are listed, but only abnormal results are displayed) Labs Reviewed - No data to display  EKG   Radiology Dg Hip Unilat With Pelvis 2-3 Views Right  Result Date: 12/30/2018 CLINICAL DATA:  Chronic right hip pain. 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 No right hip fracture or fracture of the pelvis identified. The right SI joint appears unremarkable. IMPRESSION: 1. No acute findings. If clinically warranted, follow up elective MRI of the right hip may be helpful in further investigating for internal derangement. 2. 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 Electronically Signed   By: Van Clines M.D.   On: 12/30/2018 16:25    Procedures Procedures (including critical care time)  Medications Ordered in UC Medications  ketorolac (TORADOL) injection 60 mg (60 mg Intramuscular Given 12/30/18 1551)    Initial Impression / Assessment and Plan / UC Course  I have reviewed the triage vital signs and the nursing notes.  Pertinent labs & imaging results that were available during my care of the patient were reviewed by me and considered in my medical decision making (see chart for details).   I have reviewed the x-rays with the patient which did not show any fractures or dislocations.  Told her this is likely an exacerbation of her low back radiculopathy.  He did get moderate relief from the Toradol injection.  Treat her symptomatically at this time including Mobic and Zanaflex.  Will avoid symptoms much as possible and specifically limit her sitting lifting and bending.  Do not improving she should follow-up with her primary care  physician.  She may also return to her chiropractor particularly for local measures.   Final Clinical Impressions(s) / UC Diagnoses   Final diagnoses:  Hip pain, acute, right  Lumbar radiculopathy, right     Discharge Instructions     Avoid symptoms much as possible.  Limit sitting lifting or bending.  Follow-up with your primary care physician if you are not improving in a week.  Be sure to take meloxicam with food    ED Prescriptions    Medication Sig Dispense Auth. Provider   meloxicam (MOBIC) 15 MG tablet Take 1 tablet (15 mg total) by mouth daily. 30 tablet Crecencio Mc P, PA-C   tiZANidine (ZANAFLEX) 4 MG capsule Take 1 capsule (4 mg total) by mouth 3 (three) times daily. 21 capsule Lorin Picket, PA-C     PDMP not reviewed this encounter.   Lorin Picket, PA-C 12/30/18 1816

## 2019-11-14 ENCOUNTER — Ambulatory Visit: Admit: 2019-11-14 | Disposition: A | Discharge status: Home / Self Care | Payer: Self-pay | Source: Home / Self Care

## 2019-11-14 ENCOUNTER — Other Ambulatory Visit: Payer: Self-pay

## 2019-11-14 ENCOUNTER — Encounter: Payer: Self-pay | Admitting: Emergency Medicine

## 2019-11-14 ENCOUNTER — Ambulatory Visit
Admission: EM | Admit: 2019-11-14 | Discharge: 2019-11-14 | Disposition: A | Discharge status: Home / Self Care | Payer: 59 | Attending: Internal Medicine | Admitting: Internal Medicine

## 2019-11-14 DIAGNOSIS — U071 COVID-19: Secondary | ICD-10-CM | POA: Diagnosis not present

## 2019-11-14 NOTE — ED Triage Notes (Signed)
Patient here for COVID testing for exposure to COVID. She has had fever, fatigue, chills, cough, nasal congestion and sore throat. She quarantined for 10 days.

## 2019-11-15 LAB — SARS CORONAVIRUS 2 (TAT 6-24 HRS): SARS Coronavirus 2: POSITIVE — AB

## 2020-03-13 ENCOUNTER — Ambulatory Visit: Admit: 2020-03-13 | Discharge status: Absent without leave | Payer: 59

## 2020-03-13 ENCOUNTER — Encounter: Payer: Self-pay | Admitting: Physician Assistant

## 2020-03-13 ENCOUNTER — Telehealth: Payer: Managed Care, Other (non HMO) | Admitting: Physician Assistant

## 2020-03-13 DIAGNOSIS — J069 Acute upper respiratory infection, unspecified: Secondary | ICD-10-CM

## 2020-03-13 MED ORDER — AZITHROMYCIN 250 MG PO TABS: ORAL_TABLET | ORAL | 0 refills | Status: DC

## 2020-03-13 NOTE — Patient Instructions (Addendum)
Please increase fluids. Use tylenol extra strength every 4 hours as needed for aching or headaches. Wash hands frequently and use your mask. May use over the counter cold med to assist with symptoms. Prescription for Zithromax has been called in. See your MD or return to this clinic if not improving. Please consider getting a COVID test as soon as possible. Upper Respiratory Infection, Adult An upper respiratory infection (URI) affects the nose, throat, and upper air passages. URIs are caused by germs (viruses). The most common type of URI is often called "the common cold." Medicines cannot cure URIs, but you can do things at home to relieve your symptoms. URIs usually get better within 7-10 days. Follow these instructions at home: Activity  Rest as needed.  If you have a fever, stay home from work or school until your fever is gone, or until your doctor says you may return to work or school. ? You should stay home until you cannot spread the infection anymore (you are not contagious). ? Your doctor may have you wear a face mask so you have less risk of spreading the infection. Relieving symptoms  Gargle with a salt-water mixture 3-4 times a day or as needed. To make a salt-water mixture, completely dissolve -1 tsp of salt in 1 cup of warm water.  Use a cool-mist humidifier to add moisture to the air. This can help you breathe more easily. Eating and drinking   Drink enough fluid to keep your pee (urine) pale yellow.  Eat soups and other clear broths. General instructions   Take over-the-counter and prescription medicines only as told by your doctor. These include cold medicines, fever reducers, and cough suppressants.  Do not use any products that contain nicotine or tobacco. These include cigarettes and e-cigarettes. If you need help quitting, ask your doctor.  Avoid being where people are smoking (avoid secondhand smoke).  Make sure you get regular shots and get the flu shot  every year.  Keep all follow-up visits as told by your doctor. This is important. How to avoid spreading infection to others   Wash your hands often with soap and water. If you do not have soap and water, use hand sanitizer.  Avoid touching your mouth, face, eyes, or nose.  Cough or sneeze into a tissue or your sleeve or elbow. Do not cough or sneeze into your hand or into the air. Contact a doctor if:  You are getting worse, not better.  You have any of these: ? A fever. ? Chills. ? Brown or red mucus in your nose. ? Yellow or brown fluid (discharge)coming from your nose. ? Pain in your face, especially when you bend forward. ? Swollen neck glands. ? Pain with swallowing. ? White areas in the back of your throat. Get help right away if:  You have shortness of breath that gets worse.  You have very bad or constant: ? Headache. ? Ear pain. ? Pain in your forehead, behind your eyes, and over your cheekbones (sinus pain). ? Chest pain.  You have long-lasting (chronic) lung disease along with any of these: ? Wheezing. ? Long-lasting cough. ? Coughing up blood. ? A change in your usual mucus.  You have a stiff neck.  You have changes in your: ? Vision. ? Hearing. ? Thinking. ? Mood. Summary  An upper respiratory infection (URI) is caused by a germ called a virus. The most common type of URI is often called "the common cold."  URIs usually get  better within 7-10 days.  Take over-the-counter and prescription medicines only as told by your doctor. This information is not intended to replace advice given to you by your health care provider. Make sure you discuss any questions you have with your health care provider. Document Revised: 03/22/2018 Document Reviewed: 11/04/2016 Elsevier Patient Education  2020 [employer].

## 2020-03-13 NOTE — Progress Notes (Signed)
Patient gives permission for Virtual Visit 03/13/20.  Subjective:    Patient ID: Angela Jones, female    DOB: 1985-06-14, 34 y.o.   MRN: [id number]  URI  This is a new problem. The current episode started yesterday. The problem has been gradually worsening. There has been no fever. Associated symptoms include congestion, headaches, sinus pain and a sore throat. Pertinent negatives include no coughing, diarrhea, nausea, neck pain, rash, vomiting or wheezing. Treatments tried: Over the counter medications. The treatment provided no relief.      Review of Systems  Constitutional: Positive for chills and fatigue. Negative for fever.  HENT: Positive for congestion, postnasal drip, sinus pain and sore throat.   Respiratory: Negative for cough and wheezing.   Cardiovascular: Positive for palpitations.  Gastrointestinal: Negative for diarrhea, nausea and vomiting.  Musculoskeletal: Negative for neck pain.  Skin: Negative for rash.  Neurological: Positive for headaches.  Psychiatric/Behavioral: The patient is nervous/anxious.        Objective:   Physical Exam Constitutional:      Comments: Virtual Visit           Assessment & Plan:  URI  Plan for increasing fluids. Tylenol every 4 hours for aching. I strongly encouraged patient to get COVID testing. She states she has been fully vaccinated. Continue OTC cold meds. Rx for Z-Pak.

## 2020-08-19 ENCOUNTER — Other Ambulatory Visit: Payer: Self-pay

## 2020-08-19 ENCOUNTER — Ambulatory Visit
Admission: EM | Admit: 2020-08-19 | Discharge: 2020-08-19 | Disposition: A | Discharge status: Home / Self Care | Payer: Managed Care, Other (non HMO) | Attending: Emergency Medicine | Admitting: Emergency Medicine

## 2020-08-19 ENCOUNTER — Encounter: Payer: Self-pay | Admitting: Emergency Medicine

## 2020-08-19 DIAGNOSIS — J069 Acute upper respiratory infection, unspecified: Secondary | ICD-10-CM | POA: Diagnosis not present

## 2020-08-19 DIAGNOSIS — U071 COVID-19: Secondary | ICD-10-CM | POA: Diagnosis not present

## 2020-08-19 DIAGNOSIS — Z87891 Personal history of nicotine dependence: Secondary | ICD-10-CM | POA: Insufficient documentation

## 2020-08-19 DIAGNOSIS — J040 Acute laryngitis: Secondary | ICD-10-CM | POA: Diagnosis not present

## 2020-08-19 LAB — RAPID INFLUENZA A&B ANTIGENS
Influenza A (ARMC): NEGATIVE
Influenza B (ARMC): NEGATIVE

## 2020-08-19 MED ORDER — PREDNISONE 10 MG (21) PO TBPK: ORAL_TABLET | ORAL | 0 refills | Status: AC

## 2020-08-19 MED ORDER — IPRATROPIUM BROMIDE 0.06 % NA SOLN: 2.0000 | Freq: Four times a day (QID) | NASAL | 12 refills | Status: AC

## 2020-08-19 MED ORDER — PROMETHAZINE-DM 6.25-15 MG/5ML PO SYRP: 5.0000 mL | ORAL_SOLUTION | Freq: Four times a day (QID) | ORAL | 0 refills | Status: AC | PRN

## 2020-08-19 MED ORDER — BENZONATATE 100 MG PO CAPS: 200.0000 mg | ORAL_CAPSULE | Freq: Three times a day (TID) | ORAL | 0 refills | Status: AC

## 2020-08-19 NOTE — Discharge Instructions (Addendum)
Use the Atrovent nasal spray, 2 squirts in each nostril every 6 hours, as needed for runny nose and postnasal drip.  Use the Tessalon Perles every 8 hours during the day.  Take them with a small sip of water.  They may give you some numbness to the base of your tongue or a metallic taste in your mouth, this is normal.  Use the Promethazine DM cough syrup at bedtime for cough and congestion.  It will make you drowsy so do not take it during the day.  If your COVID test is negative start the prednisone Dosepak tomorrow and take it according to the package instructions to help resolve your laryngitis.  Rest her voice is much as possible and drink lots of cool or warm fluids, whichever feels better to your throat.  Return for reevaluation or see your primary care provider for any new or worsening symptoms.

## 2020-08-19 NOTE — ED Provider Notes (Signed)
[facility]    CSN: [id number] Arrival date & time: 08/19/20  1909      History   Chief Complaint Chief Complaint  Patient presents with  . Cough  . Fever  . Nasal Congestion    HPI Angela Jones is a 34 y.o. female.   HPI   35 year old female here for evaluation of respiratory complaints.  Patient reports that she has been experiencing runny nose with nasal congestion, sore throat, productive cough, and fever for the last 5 days.  She also reports that she has been coughing some but she is starting to lose her voice.  She denies ear pain or pressure, shortness breath or wheezing, GI complaints, or body aches.  Patient has been tested for COVID and strep and was -2 days ago.  Past Medical History:  Diagnosis Date  . Anxiety   . Depression     There are no problems to display for this patient.   Past Surgical History:  Procedure Laterality Date  . ABDOMINAL HYSTERECTOMY    . CESAREAN SECTION    . CHOLECYSTECTOMY    . GASTRIC ROUX-EN-Y    . HERNIA REPAIR    . WISDOM TOOTH EXTRACTION      OB History   No obstetric history on file.      Home Medications    Prior to Admission medications   Medication Sig Start Date End Date Taking? Authorizing Provider  ARIPiprazole (ABILIFY) 5 MG tablet Take by mouth. 07/17/20  Yes [provider]  benzonatate (TESSALON) 100 MG capsule Take 2 capsules (200 mg total) by mouth every 8 (eight) hours. 08/19/20  Yes Becky Augusta, NP  diazepam (VALIUM) 5 MG tablet Take 5 mg by mouth as needed for anxiety.   Yes [provider]  escitalopram (LEXAPRO) 10 MG tablet 30 mg.  07/26/17  Yes [provider]  ipratropium (ATROVENT) 0.06 % nasal spray Place 2 sprays into both nostrils 4 (four) times daily. 08/19/20  Yes Becky Augusta, NP  predniSONE (STERAPRED UNI-PAK 21 TAB) 10 MG (21) TBPK tablet Take 6 tablets on day 1, 5 tablets day 2, 4 tablets day 3, 3 tablets day 4, 2 tablets day 5, 1 tablet day 6  08/19/20  Yes Becky Augusta, NP  promethazine-dextromethorphan (PROMETHAZINE-DM) 6.25-15 MG/5ML syrup Take 5 mLs by mouth 4 (four) times daily as needed. 08/19/20  Yes Becky Augusta, NP  propranolol (INDERAL) 10 MG tablet propranolol 10 mg tablet  TAKE UP TO TWICE DAILY AS NEEDED FOR ANXIETY/PANIC SYMPTOMS 01/25/20  Yes [provider]  gabapentin (NEURONTIN) 300 MG capsule 1 CAP BEDTIME X7DAYS, 2CAPS X7DAYS,1CAP IN AM,2CAPS AT NIGHT X7DAYS,1CAP AM/AFTERNOON,2CAPS AT NIGHT 06/06/17 12/30/18  [provider]  metoprolol succinate (TOPROL-XL) 25 MG 24 hr tablet TAKE 1 TABLET BY MOUTH EVERY DAY 11/12/18 08/19/20  [provider]  promethazine (PHENERGAN) 25 MG tablet Take by mouth. 12/25/14 12/30/18  [provider]    Family History Family History  Problem Relation Age of Onset  . COPD Mother     Social History Social History   Tobacco Use  . Smoking status: Former Smoker    Types: Cigarettes    Quit date: 09/16/2004    Years since quitting: 15.9  . Smokeless tobacco: Never Used  Vaping Use  . Vaping Use: Former  Substance Use Topics  . Alcohol use: Yes    Comment: social  . Drug use: No     Allergies   Bee venom, Penicillins, Piperacillin sod-tazobactam  so, Skelaxin [metaxalone], Hydromorphone, and Latex   Review of Systems Review of Systems  Constitutional: Positive for fever. Negative for activity change and appetite change.  HENT: Positive for congestion, rhinorrhea, sore throat and voice change. Negative for ear pain.   Respiratory: Positive for cough. Negative for shortness of breath and wheezing.   Gastrointestinal: Negative for diarrhea, nausea and vomiting.  Skin: Negative for rash.  Hematological: Negative.   Psychiatric/Behavioral: Negative.      Physical Exam Triage Vital Signs ED Triage Vitals [08/19/20 1938]  Enc Vitals Group     BP      Pulse      Resp      Temp      Temp src      SpO2      Weight 189 lb (85.7 kg)      Height 5\' 5"  (1.651 m)     Head Circumference      Peak Flow      Pain Score 5     Pain Loc      Pain Edu?      Excl. in GC?    No data found.  Updated Vital Signs BP 99/82 (BP Location: Right Arm)   Pulse 71   Temp 98.6 F (37 C) (Oral)   Resp 18   Ht 5\' 5"  (1.651 m)   Wt 189 lb (85.7 kg)   LMP 04/23/2017   SpO2 100%   BMI 31.45 kg/m   Visual Acuity Right Eye Distance:   Left Eye Distance:   Bilateral Distance:    Right Eye Near:   Left Eye Near:    Bilateral Near:     Physical Exam Vitals and nursing note reviewed.  Constitutional:      General: She is not in acute distress.    Appearance: Normal appearance. She is normal weight.  HENT:     Head: Normocephalic and atraumatic.     Right Ear: Tympanic membrane, ear canal and external ear normal.     Left Ear: Tympanic membrane, ear canal and external ear normal.     Nose: Congestion and rhinorrhea present.     Mouth/Throat:     Mouth: Mucous membranes are moist.     Pharynx: Oropharynx is clear. No posterior oropharyngeal erythema.  Cardiovascular:     Rate and Rhythm: Normal rate and regular rhythm.     Pulses: Normal pulses.     Heart sounds: Normal heart sounds. No murmur heard. No gallop.   Pulmonary:     Effort: Pulmonary effort is normal.     Breath sounds: Normal breath sounds. No wheezing, rhonchi or rales.  Musculoskeletal:     Cervical back: Normal range of motion and neck supple.  Lymphadenopathy:     Cervical: Cervical adenopathy present.  Skin:    General: Skin is warm and dry.     Capillary Refill: Capillary refill takes less than 2 seconds.     Findings: No erythema or rash.  Neurological:     General: No focal deficit present.     Mental Status: She is alert and oriented to person, place, and time.  Psychiatric:        Mood and Affect: Mood normal.        Behavior: Behavior normal.        Thought Content: Thought content normal.        Judgment: Judgment normal.      UC  Treatments / Results  Labs (all labs ordered are listed,  but only abnormal results are displayed) Labs Reviewed  SARS CORONAVIRUS 2 (TAT 6-24 HRS)  RAPID INFLUENZA A&B ANTIGENS    EKG   Radiology No results found.  Procedures Procedures (including critical care time)  Medications Ordered in UC Medications - No data to display  Initial Impression / Assessment and Plan / UC Course  I have reviewed the triage vital signs and the nursing notes.  Pertinent labs & imaging results that were available during my care of the patient were reviewed by me and considered in my medical decision making (see chart for details).   Patient is a very pleasant 35 year old female here for evaluation of respiratory complaints of been going on for last 5 days.  2 days ago she was tested for COVID and strep and was negative.  Her symptoms include runny nose for clear mucus, nasal congestion, sore throat and laryngitis, productive cough for a yellow sputum, and fever with a T-max of 101.  She denies ear pain or pressure, shortness breath or wheezing, GI complaints, or body aches.  Physical exam reveals pearly gray tympanic membranes bilaterally with normal light reflex and clear external auditory canals.  Nasal mucosa is erythematous and edematous with clear nasal discharge.  Oropharyngeal exam reveals posterior oropharyngeal erythema with clear postnasal drip.  Patient does have bilateral anterior cervical lymphadenopathy.  Cardiopulmonary exam is benign.  Patient's exam is consistent with a URI and laryngitis.  Patient is requesting to be reswabbed for COVID and also for flu.  I explained to the patient that she is outside the therapeutic window even if she is flu positive for treatment.  We will discharge patient home with a diagnosis of viral URI with a cough with Atrovent nasal spray, Tessalon Perles, Promethazine DM cough syrup for cough and congestion.  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  Work note provided.   Final Clinical Impressions(s) / UC Diagnoses   Final diagnoses:  Viral URI with cough  Laryngitis     Discharge Instructions     Use the Atrovent nasal spray, 2 squirts in each nostril every 6 hours, as needed for runny nose and postnasal drip.  Use the Tessalon Perles every 8 hours during the day.  Take them with a small sip of water.  They may give you some numbness to the base of your tongue or a metallic taste in your mouth, this is normal.  Use the Promethazine DM cough syrup at bedtime for cough and congestion.  It will make you drowsy so do not take it during the day.  If your COVID test is negative start the prednisone Dosepak tomorrow and take it according to the package instructions to help resolve your laryngitis.  Rest her voice is much as possible and drink lots of cool or warm fluids, whichever feels better to your throat.  Return for reevaluation or see your primary care provider for any new or worsening symptoms.     ED Prescriptions    Medication Sig Dispense Auth. Provider   benzonatate (TESSALON) 100 MG capsule Take 2 capsules (200 mg total) by mouth every 8 (eight) hours. 21 capsule Becky Augusta, NP   ipratropium (ATROVENT) 0.06 % nasal spray Place 2 sprays into both nostrils 4 (four) times daily. 15 mL Becky Augusta, NP   promethazine-dextromethorphan (PROMETHAZINE-DM) 6.25-15 MG/5ML syrup Take 5 mLs by mouth 4 (  four) times daily as needed. 118 mL Becky Augusta, NP   predniSONE (STERAPRED UNI-PAK 21 TAB) 10 MG (21) TBPK tablet Take 6 tablets on day 1, 5 tablets day 2, 4 tablets day 3, 3 tablets day 4, 2 tablets day 5, 1 tablet day 6 21 tablet Becky Augusta, NP     PDMP not reviewed this encounter.   Becky Augusta, NP 08/19/20 2001

## 2020-08-19 NOTE — ED Triage Notes (Signed)
Patient c/o cough, nasal congestion and low grade fever that started Friday. She was seen Monday in another urgent care and tested negative for COVID and strep. She was told if her symptoms didn't improve to be retested today.

## 2020-08-20 LAB — SARS CORONAVIRUS 2 (TAT 6-24 HRS): SARS Coronavirus 2: POSITIVE — AB

## 2021-08-01 IMAGING — CR DG HIP (WITH OR WITHOUT PELVIS) 2-3V*R*
3 series · 3 of 3 positions shown · non-contrast
Comparison: CT pelvis from 03/05/2004

CLINICAL DATA: Chronic right hip pain.

EXAM:
DG HIP (WITH OR WITHOUT PELVIS) 2-3V RIGHT

[pelvis ap]
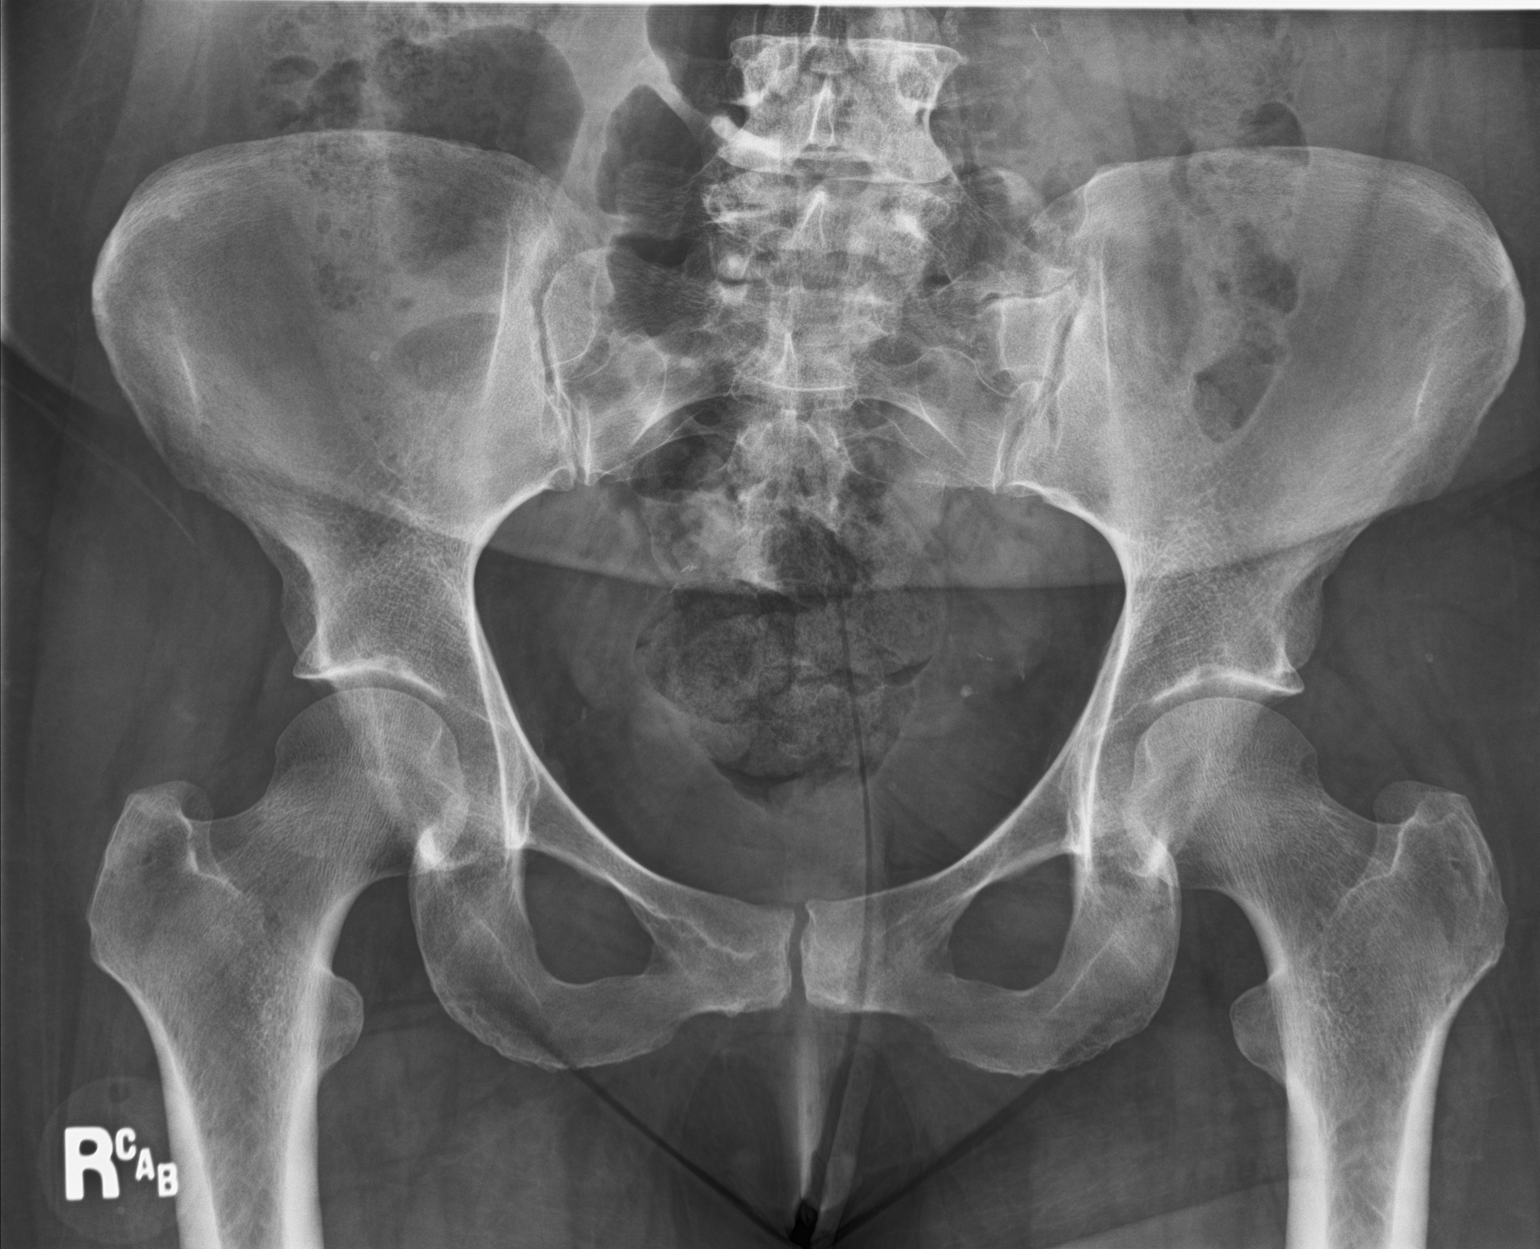

[hip ap]
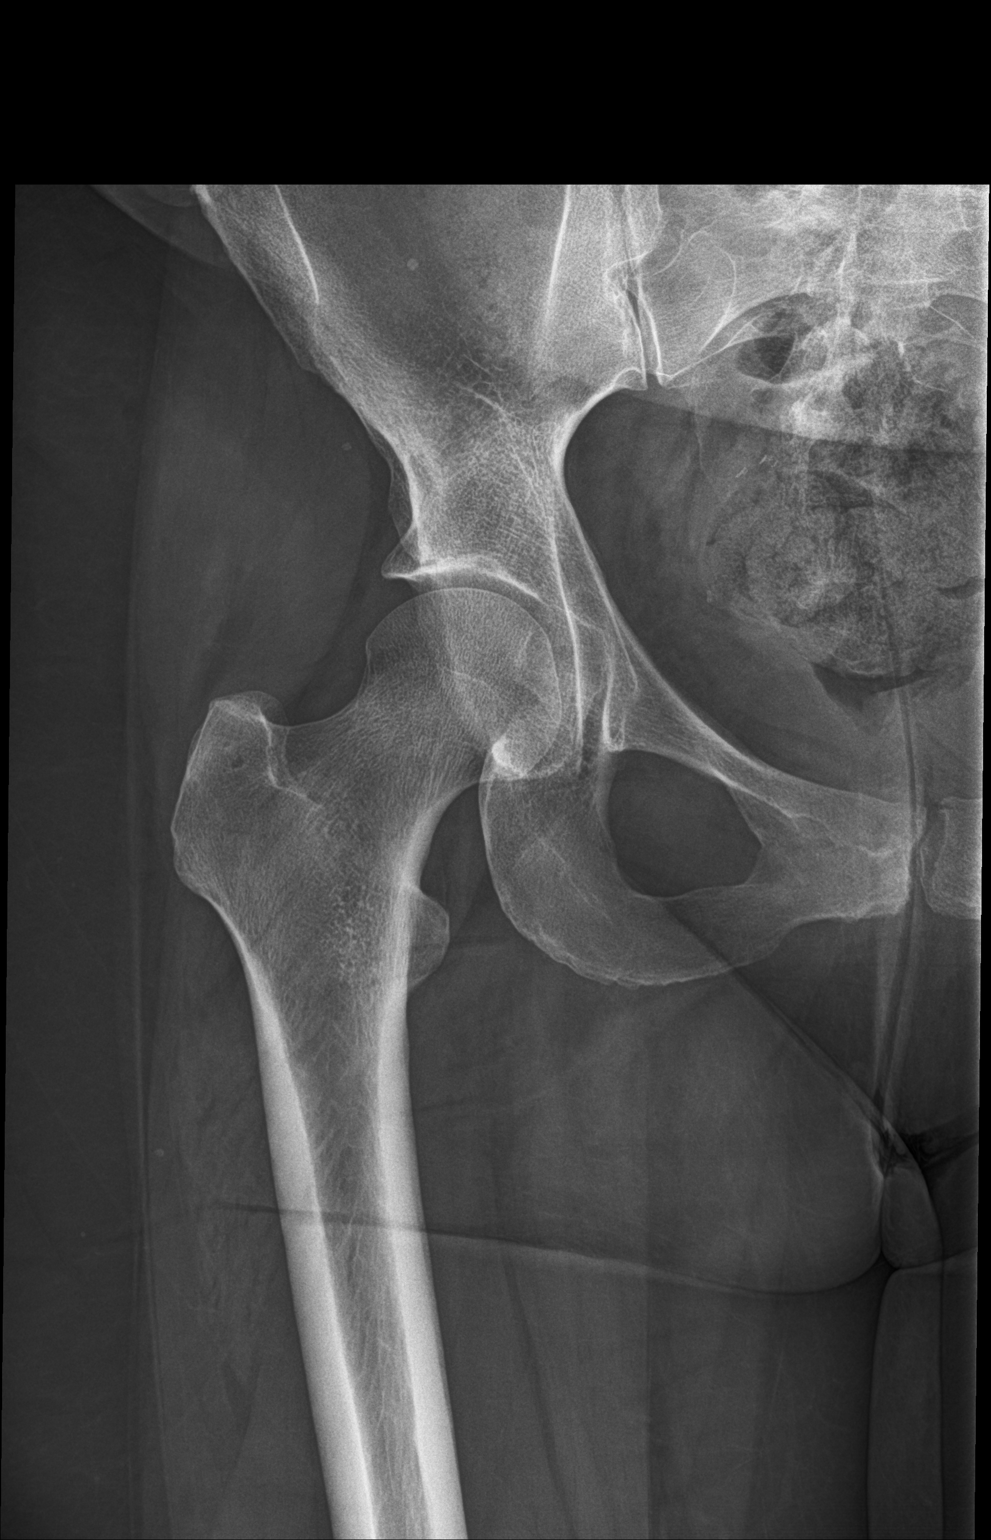

[hip lat]
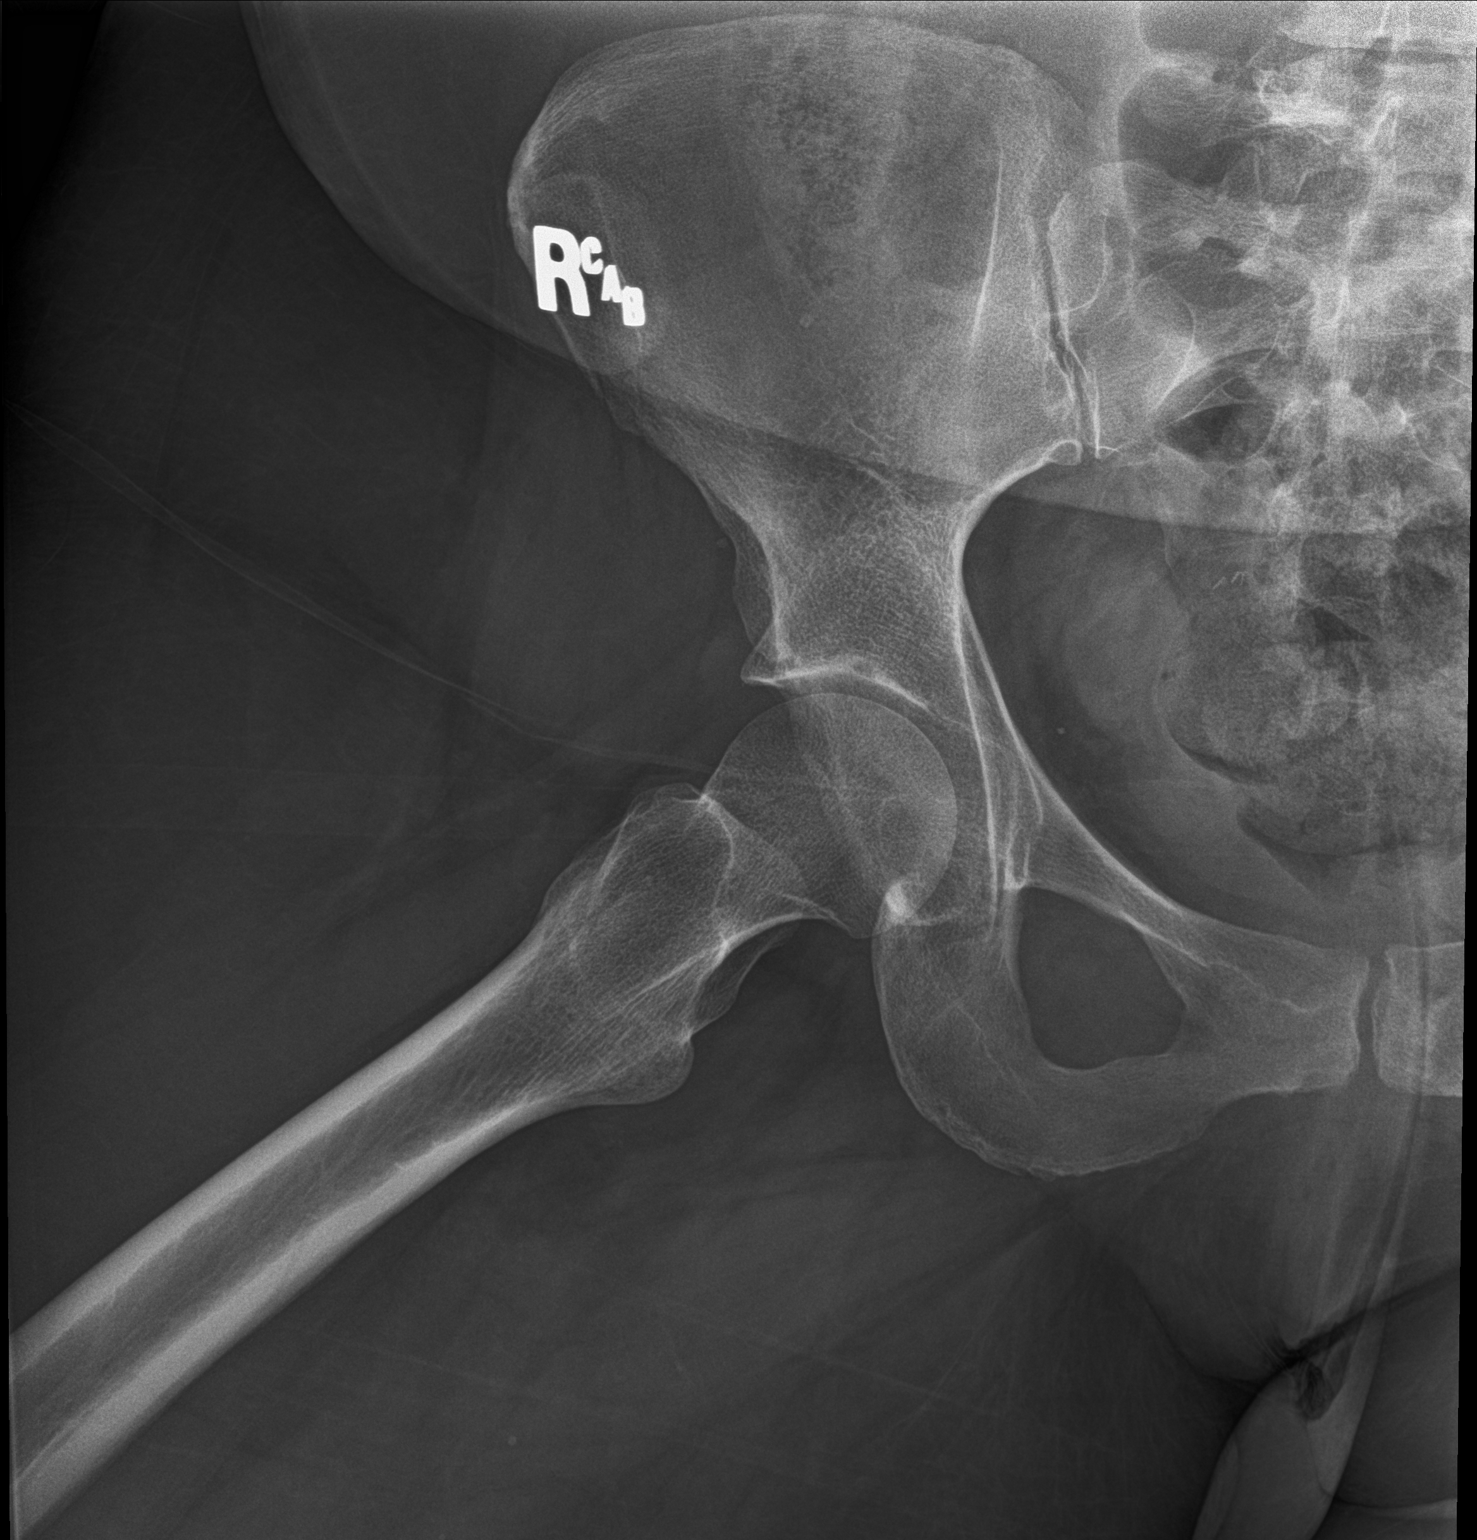

[3 of 3 positions shown; findings below may reference images not displayed]

FINDINGS: Transitional lumbosacral vertebra with a large left transverse
process that pseudo articulates with the rest sacrum.

No right hip fracture or fracture of the pelvis identified. The
right SI joint appears unremarkable.
IMPRESSION: 1. No acute findings. If clinically warranted, follow up elective
MRI of the right hip may be helpful in further investigating for
internal derangement.
2. Transitional lumbosacral vertebra with a large left transverse
process which pseudo articulates with the rest of the sacrum.
